# Patient Record
Sex: Male | Born: 2000 | Race: White | Hispanic: No | Marital: Single | Smoking: Never smoker
Health system: Southern US, Community
[De-identification: ages and names within clinical notes are randomized; demographics above are authoritative.]

## PROBLEM LIST (undated history)

## (undated) DIAGNOSIS — F4310 Post-traumatic stress disorder, unspecified: Secondary | ICD-10-CM

## (undated) DIAGNOSIS — F909 Attention-deficit hyperactivity disorder, unspecified type: Secondary | ICD-10-CM

## (undated) DIAGNOSIS — G3184 Mild cognitive impairment, so stated: Secondary | ICD-10-CM

## (undated) HISTORY — DX: Attention-deficit hyperactivity disorder, unspecified type: F90.9

## (undated) HISTORY — DX: Mild cognitive impairment of uncertain or unknown etiology: G31.84

## (undated) HISTORY — DX: Post-traumatic stress disorder, unspecified: F43.10

---

## 2020-07-12 ENCOUNTER — Other Ambulatory Visit: Payer: Self-pay

## 2020-07-12 ENCOUNTER — Ambulatory Visit
Admission: EM | Admit: 2020-07-12 | Discharge: 2020-07-12 | Disposition: A | Discharge status: Home / Self Care | Payer: Medicaid Other | Attending: Emergency Medicine | Admitting: Emergency Medicine

## 2020-07-12 DIAGNOSIS — R310 Gross hematuria: Secondary | ICD-10-CM | POA: Insufficient documentation

## 2020-07-12 LAB — POCT URINALYSIS DIP (MANUAL ENTRY)
Bilirubin, UA: NEGATIVE
Glucose, UA: NEGATIVE mg/dL
Ketones, POC UA: NEGATIVE mg/dL
Nitrite, UA: NEGATIVE
Protein Ur, POC: 100 mg/dL — AB
Spec Grav, UA: 1.025 (ref 1.010–1.025)
Urobilinogen, UA: 0.2 E.U./dL
pH, UA: 6.5 (ref 5.0–8.0)

## 2020-07-12 MED ORDER — SULFAMETHOXAZOLE-TRIMETHOPRIM 800-160 MG PO TABS: 1.0000 | ORAL_TABLET | Freq: Two times a day (BID) | ORAL | 0 refills | Status: AC

## 2020-07-12 NOTE — ED Provider Notes (Signed)
[facility]    CSN: [id number] Arrival date & time: 07/12/20  1745      History   Chief Complaint Chief Complaint  Patient presents with  . Blood in Urine    Started today    HPI Andrew Erickson is a 20 y.o. male presenting today for evaluation of hematuria.  Reports this afternoon he noticed his urine was a little bit darker, identically straining of his multiple drops of blood.  He denies any associated dysuria, pain with urination or tingling.  Denies penile discharge.  Patient is sexually active.  He denies any associate abdominal pain or back pain.  Denies history of similar.  HPI  History reviewed. No pertinent past medical history.  There are no problems to display for this patient.   History reviewed. No pertinent surgical history.     Home Medications    Prior to Admission medications   Medication Sig Start Date End Date Taking? Authorizing Provider  amphetamine-dextroamphetamine (ADDERALL) 10 MG tablet Take 10 mg by mouth daily with breakfast.   Yes [provider]  cloNIDine (CATAPRES) 0.1 MG tablet Take 0.1 mg by mouth 3 (three) times daily.   Yes [provider]  levocetirizine (XYZAL) 5 MG tablet Take 5 mg by mouth every evening.   Yes [provider]  lisdexamfetamine (VYVANSE) 60 MG capsule Take 60 mg by mouth every morning.   Yes [provider]  polyethylene glycol (MIRALAX / GLYCOLAX) 17 g packet Take 17 g by mouth 2 (two) times daily.   Yes [provider]  sertraline (ZOLOFT) 25 MG tablet Take 25 mg by mouth daily.   Yes [provider]  sulfamethoxazole-trimethoprim (BACTRIM DS) 800-160 MG tablet Take 1 tablet by mouth 2 (two) times daily for 7 days. 07/12/20 07/19/20 Yes Kiera Hussey C, PA-C  traZODone (DESYREL) 100 MG tablet Take 100 mg by mouth at bedtime.   Yes [provider]    Family History History reviewed. No pertinent family history.  Social History Social History    Tobacco Use  . Smoking status: Never Smoker  . Smokeless tobacco: Never Used  Vaping Use  . Vaping Use: Never used  Substance Use Topics  . Alcohol use: Not Currently  . Drug use: Not Currently     Allergies   Patient has no known allergies.   Review of Systems Review of Systems  Constitutional: Negative for fever.  HENT: Negative for sore throat.   Respiratory: Negative for shortness of breath.   Cardiovascular: Negative for chest pain.  Gastrointestinal: Negative for abdominal pain, nausea and vomiting.  Genitourinary: Positive for hematuria. Negative for difficulty urinating, dysuria, frequency, penile discharge, penile pain, penile swelling, scrotal swelling and testicular pain.  Skin: Negative for rash.  Neurological: Negative for dizziness, light-headedness and headaches.     Physical Exam Triage Vital Signs ED Triage Vitals  Enc Vitals Group     BP      Pulse      Resp      Temp      Temp src      SpO2      Weight      Height      Head Circumference      Peak Flow      Pain Score      Pain Loc      Pain Edu?      Excl. in GC?    No data found.  Updated Vital Signs BP 113/61 (BP  Location: Left Arm)   Pulse 63   Temp 98.5 F (36.9 C) (Oral)   Resp 16   SpO2 97%   Visual Acuity Right Eye Distance:   Left Eye Distance:   Bilateral Distance:    Right Eye Near:   Left Eye Near:    Bilateral Near:     Physical Exam Vitals and nursing note reviewed.  Constitutional:      Appearance: He is well-developed and well-nourished.     Comments: No acute distress  HENT:     Head: Normocephalic and atraumatic.     Nose: Nose normal.  Eyes:     Conjunctiva/sclera: Conjunctivae normal.  Cardiovascular:     Rate and Rhythm: Normal rate.  Pulmonary:     Effort: Pulmonary effort is normal. No respiratory distress.  Abdominal:     General: There is no distension.  Genitourinary:    Comments: Circumcised, no rashes or lesions noted to shaft or  glans of penis, no urethral erythema noted at meatus, no discharge visualized Musculoskeletal:        General: Normal range of motion.     Cervical back: Neck supple.  Skin:    General: Skin is warm and dry.  Neurological:     Mental Status: He is alert and oriented to person, place, and time.  Psychiatric:        Mood and Affect: Mood and affect normal.      UC Treatments / Results  Labs (all labs ordered are listed, but only abnormal results are displayed) Labs Reviewed  POCT URINALYSIS DIP (MANUAL ENTRY) - Abnormal; Notable for the following components:      Result Value   Clarity, UA cloudy (*)    Blood, UA large (*)    Protein Ur, POC =100 (*)    Leukocytes, UA Large (3+) (*)    All other components within normal limits  URINE CULTURE  CYTOLOGY, (ORAL, ANAL, URETHRAL) ANCILLARY ONLY    EKG   Radiology No results found.  Procedures Procedures (including critical care time)  Medications Ordered in UC Medications - No data to display  Initial Impression / Assessment and Plan / UC Course  I have reviewed the triage vital signs and the nursing notes.  Pertinent labs & imaging results that were available during my care of the patient were reviewed by me and considered in my medical decision making (see chart for details).     UA with large leuks, large blood, without any back pain or groin pain, lower suspicion of stone, given patient's age also concern for possible STD, but given hematuria atypical symptom of STD opting to go ahead and treat for UTI with Bactrim twice daily x1 week.  Urethral swab pending along with a urine culture to check for further confirmation.  Continue to monitor symptoms and resolution of hematuria.  Follow-up if any symptoms not improving or worsening or developing any backslash groin pain.  Discussed strict return precautions. Patient verbalized understanding and is agreeable with plan.  Final Clinical Impressions(s) / UC Diagnoses    Final diagnoses:  Gross hematuria     Discharge Instructions     Begin Bactrim twice daily for the next week Drink plenty of water Urine culture pending and STD screening pending-we will call with results and alter treatment as needed Please monitor for resolution of blood in urine, follow-up if any symptoms not improving or worsening    ED Prescriptions    Medication Sig Dispense Auth. Provider  sulfamethoxazole-trimethoprim (BACTRIM DS) 800-160 MG tablet Take 1 tablet by mouth 2 (two) times daily for 7 days. 14 tablet Jayvier Burgher, [place] C, PA-C     PDMP not reviewed this encounter.   Lew Dawes, [place] 07/12/20 1845

## 2020-07-12 NOTE — ED Triage Notes (Signed)
Patient states he noticed blood in his urine this date. Pt denies any injury. Pt states it was a small amount but pt's caregiver states it was a moderate to large amount in the toilet. Pt is aox4 and ambulatory.

## 2020-07-12 NOTE — Discharge Instructions (Addendum)
Begin Bactrim twice daily for the next week Drink plenty of water Urine culture pending and STD screening pending-we will call with results and alter treatment as needed Please monitor for resolution of blood in urine, follow-up if any symptoms not improving or worsening

## 2020-07-14 LAB — URINE CULTURE: Culture: <10000 — AB

## 2020-07-15 LAB — CYTOLOGY, (ORAL, ANAL, URETHRAL) ANCILLARY ONLY
Chlamydia: NEGATIVE
Comment: NEGATIVE
Comment: NEGATIVE
Comment: NORMAL
Neisseria Gonorrhea: NEGATIVE
Trichomonas: NEGATIVE

## 2020-07-16 ENCOUNTER — Ambulatory Visit (HOSPITAL_COMMUNITY)
Admission: EM | Admit: 2020-07-16 | Discharge: 2020-07-16 | Disposition: A | Discharge status: Home / Self Care | Payer: Medicaid Other | Attending: Family Medicine | Admitting: Family Medicine

## 2020-11-17 ENCOUNTER — Emergency Department (HOSPITAL_COMMUNITY): Payer: Medicaid Other

## 2020-11-17 ENCOUNTER — Other Ambulatory Visit: Payer: Self-pay

## 2020-11-17 ENCOUNTER — Emergency Department (HOSPITAL_COMMUNITY)
Admission: EM | Admit: 2020-11-17 | Discharge: 2020-11-18 | Disposition: A | Discharge status: Home / Self Care | Payer: Medicaid Other | Attending: Emergency Medicine | Admitting: Emergency Medicine

## 2020-11-17 ENCOUNTER — Encounter: Payer: Self-pay | Admitting: Emergency Medicine

## 2020-11-17 ENCOUNTER — Encounter (HOSPITAL_COMMUNITY): Payer: Self-pay

## 2020-11-17 DIAGNOSIS — S139XXA Sprain of joints and ligaments of unspecified parts of neck, initial encounter: Secondary | ICD-10-CM

## 2020-11-17 DIAGNOSIS — S20221A Contusion of right back wall of thorax, initial encounter: Secondary | ICD-10-CM

## 2020-11-17 DIAGNOSIS — Y92017 Garden or yard in single-family (private) house as the place of occurrence of the external cause: Secondary | ICD-10-CM | POA: Diagnosis not present

## 2020-11-17 DIAGNOSIS — F909 Attention-deficit hyperactivity disorder, unspecified type: Secondary | ICD-10-CM | POA: Insufficient documentation

## 2020-11-17 DIAGNOSIS — M542 Cervicalgia: Secondary | ICD-10-CM | POA: Diagnosis not present

## 2020-11-17 DIAGNOSIS — M549 Dorsalgia, unspecified: Secondary | ICD-10-CM | POA: Diagnosis present

## 2020-11-17 DIAGNOSIS — M546 Pain in thoracic spine: Secondary | ICD-10-CM | POA: Insufficient documentation

## 2020-11-17 DIAGNOSIS — W1789XA Other fall from one level to another, initial encounter: Secondary | ICD-10-CM | POA: Insufficient documentation

## 2020-11-17 MED ORDER — IBUPROFEN 200 MG PO TABS
400.0000 mg | ORAL_TABLET | Freq: Once | ORAL | Status: AC
  Administered 2020-11-18: 01:00:00 400 mg via ORAL
  Filled 2020-11-17: qty 2

## 2020-11-17 MED ORDER — ACETAMINOPHEN 325 MG PO TABS: 650.0000 mg | ORAL_TABLET | ORAL | Status: DC | PRN

## 2020-11-17 NOTE — ED Triage Notes (Signed)
Patient BIB [facility] from a "group home". EMS reports patient has mild cognitive impairment and Autistic. Patient reportedly got into an argument with his caregiver and purposely fell backwards off the deck 4-5 ft.Mild abrasion noted on right side of back. Patient has more c/o right shoulder and neck pain. Patient denies SI/HI to EMS. EMS stated patient was just impulsive and was not trying harm himself/

## 2020-11-17 NOTE — BH Assessment (Addendum)
Attempted to set up TTS assessment with patient's nurse. Per patient's RN, Andrew Erickson, patient is not ready in a private place for his assessment. She will let me know when he is ready.

## 2020-11-17 NOTE — ED Notes (Signed)
Pt given sandwich and water after approval from EDP Rees.

## 2020-11-17 NOTE — Discharge Instructions (Addendum)
For your behavioral health needs, you are advised to follow up with your regular outpatient provider.  For enhanced services contact your care coordinator at [facility]:       [facility]      [phone]      Care Coordinator: Alice Reichert, [id number]      Substitute for week of 11/17/20 - 11/24/20: Joe Ward [phone]      24 Hour Crisis number: [phone]

## 2020-11-17 NOTE — ED Provider Notes (Signed)
[facility]-EMERGENCY DEPT Provider Note   CSN: [id number] Arrival date & time: 11/17/20  2004     History Chief Complaint  Patient presents with   Back Pain    Damarious Holtsclaw is a 20 y.o. male.  The history is provided by the patient.  Back Pain Larkin Morelos is a 20 y.o. male who presents to the Emergency Department complaining of fall, back pain. He presents the emergency department from group home after he fell off of a balcony that was approximately 4 to 5 feet high. He states that he was upset and trying to get out of a situation that made him upset. He was not trying to hurt himself. He complains of pain to the right upper back, right lateral neck. He is able to walk without difficulty. No difficulty breathing, numbness, weakness.    Past Medical History:  Diagnosis Date   ADHD (attention deficit hyperactivity disorder)    Mild cognitive impairment    PTSD (post-traumatic stress disorder)     Patient Active Problem List   Diagnosis Date Noted   ADHD (attention deficit hyperactivity disorder) 11/17/2020    No past surgical history on file.     No family history on file.  Social History   Tobacco Use   Smoking status: Never   Smokeless tobacco: Never  Vaping Use   Vaping Use: Never used  Substance Use Topics   Alcohol use: Not Currently   Drug use: Not Currently    Home Medications Prior to Admission medications   Medication Sig Start Date End Date Taking? Authorizing Provider  amphetamine-dextroamphetamine (ADDERALL) 10 MG tablet Take 10 mg by mouth daily with breakfast.    [provider]  cloNIDine (CATAPRES) 0.1 MG tablet Take 0.1 mg by mouth 3 (three) times daily.    [provider]  levocetirizine (XYZAL) 5 MG tablet Take 5 mg by mouth every evening.    [provider]  lisdexamfetamine (VYVANSE) 60 MG capsule Take 60 mg by mouth every morning.    [provider]  polyethylene glycol (MIRALAX /  GLYCOLAX) 17 g packet Take 17 g by mouth 2 (two) times daily.    [provider]  sertraline (ZOLOFT) 25 MG tablet Take 25 mg by mouth daily.    [provider]  traZODone (DESYREL) 100 MG tablet Take 100 mg by mouth at bedtime.    [provider]    Allergies    Patient has no known allergies.  Review of Systems   Review of Systems  Musculoskeletal:  Positive for back pain.  All other systems reviewed and are negative.  Physical Exam Updated Vital Signs BP (!) 100/59 (BP Location: Right Arm)   Pulse (!) 57   Temp 98.6 F (37 C) (Oral)   Resp 18   SpO2 96%   Physical Exam Vitals and nursing note reviewed.  Constitutional:      Appearance: He is well-developed.  HENT:     Head: Normocephalic and atraumatic.  Neck:     Comments: No midline cervical spine tenderness Cardiovascular:     Rate and Rhythm: Normal rate and regular rhythm.     Heart sounds: No murmur heard. Pulmonary:     Effort: Pulmonary effort is normal. No respiratory distress.     Breath sounds: Normal breath sounds.  Abdominal:     Palpations: Abdomen is soft.     Tenderness: There is no abdominal tenderness. There is no guarding or rebound.  Musculoskeletal:  General: No tenderness.     Cervical back: Neck supple.     Comments: No midline thoracic or lumbar tenderness to palpation.  Skin:    General: Skin is warm and dry.  Neurological:     Mental Status: He is alert and oriented to person, place, and time.     Comments: Five out of five strength in all four extremities with sensation to light touch intact in all four extremities  Psychiatric:        Behavior: Behavior normal.    ED Results / Procedures / Treatments   Labs (all labs ordered are listed, but only abnormal results are displayed) Labs Reviewed - No data to display  EKG None  Radiology No results found.  Procedures Procedures   Medications Ordered in ED Medications  ibuprofen (ADVIL) tablet  400 mg (has no administration in time range)    ED Course  I have reviewed the triage vital signs and the nursing notes.  Pertinent labs & imaging results that were available during my care of the patient were reviewed by me and considered in my medical decision making (see chart for details).    MDM Rules/Calculators/A&P                         patient here for evaluation after jumping off the balcony. This was an impulsive event and he was not trying to harm himself. In terms of this event there is no evidence of serious internal injury. He does have a pulled muscle to the lateral neck. Discussed with patient's caregiver. Caregivers concern for the patient safety as he has worsening impulsive behavior. He does have PRN Ativan, which the caregiver thinks is worsening these behaviors. Caregiver is requesting psychiatry evaluation for recommendations regarding medications versus placement. Patient has been medically cleared for psychiatric evaluation and treatment. Final Clinical Impression(s) / ED Diagnoses Final diagnoses:  None    Rx / DC Orders ED Discharge Orders     None        Tilden Fossa, MD 11/17/20 2329

## 2020-11-18 NOTE — BH Assessment (Signed)
Followed up with patient's nurse regarding set up for tele-assessment. Patient is not yet in a private place. Nurse said she would let us know when he was.

## 2020-11-18 NOTE — BH Assessment (Signed)
Comprehensive Clinical Assessment (CCA) Note  11/18/2020 Andrew Erickson [id number]  DISPOSITION: Gave clinical report to [name], NP who recommends Pt be observed until TTS can obtain collateral information from legal guardian. Plan is for Pt to be psychiatrically cleared and returned to guardian's care with follow up by current providers. TTS has left guardian voicemail.  The patient demonstrates the following risk factors for suicide: Chronic risk factors for suicide include: psychiatric disorder of ADHD . Acute risk factors for suicide include: family or marital conflict. Protective factors for this patient include: positive social support, positive therapeutic relationship, hope for the future, and life satisfaction. Considering these factors, the overall suicide risk at this point appears to be low. Patient is appropriate for outpatient follow up.  Flowsheet Row ED from 11/17/2020 in [facility] Andrew Erickson ED from 07/12/2020 in Andrew Erickson   C-SSRS RISK CATEGORY No Risk No Risk      Pt is a 20 year old single male who presents unaccompanied to [facility] ED after being transported by EMS after intentionally falling 4-5 feet from a balcony. Per medical record, Pt has a diagnosis of ADHD and mild cognitive impairment. Pt says he lives in an AFL with his legal guardian, Andrew Erickson [phone]. Pt says he and his guardian were arguing because Pt wanted to spend his money on a drone. Pt reports he intentionally fell off the balcony "to prove a point" that his guardian does not care about Pt's welfare as much as he professes. Pt says his guardian did not seem very concerned after Pt fell and simply called 911.   Pt denies his intent was to seriously harm himself. He denies experiencing suicidal ideation and denies this was a suicide attempt. He denies any history of suicide attempts or NSSIB. Pt denies depressive symptoms and says his mood as  been good. He denies problems with sleep or appetite. He denies current homicidal ideation or history of aggression. He denies auditory or visual hallucinations. He denies use of alcohol or other substances.  Pt does not identify any specific stressors. He says his father died 3-4 years ago from an infection and Pt is still grieving this loss. Pt says he has lived in current AFL 6-7 months. Pt says he enjoys a lot of activities including working on cars, riding 4-wheelers, and fishing. He says he is entering the eleventh grade. Pt denies history of abuse. He denies legal problems. He denies access to firearms. Pt says he currently does not see a therapist. He says he is not sure what medications he is prescribed but he takes them regularly. Pt states he does not know who prescribes his medications.  TTS attempted to contact Andrew Erickson at [phone] for collateral information. HIPAA-compliant voicemail was recorded asking Mr Andrew Erickson to contact TTS.  Pt is dressed in t-shirt and is alert and oriented x4. Pt speaks in a clear tone, at moderate volume and normal pace. Motor behavior appears normal. Eye contact is good. Pt's mood is euthymic and affect is congruent with mood. Thought process is coherent and relevant. There is no indication Pt is currently responding to internal stimuli or experiencing delusional thought content. Pt was polite and cooperative throughout assessment. He says he feels comfortable returning to the AFL.   Chief Complaint:  Chief Complaint  Patient presents with   Back Pain   Visit Diagnosis: F90.2 Attention-deficit/hyperactivity disorder    CCA Screening, Triage and Referral (STR)  Patient  Reported Information How did you hear about [place]? No data recorded Referral name: No data recorded Referral phone number: No data recorded  Whom do you see for routine medical problems? No data recorded Practice/Facility Name: No data recorded Practice/Facility Phone Number: No  data recorded Name of Contact: No data recorded Contact Number: No data recorded Contact Fax Number: No data recorded Prescriber Name: No data recorded Prescriber Address (if known): No data recorded  What Is the Reason for Your Visit/Call Today? No data recorded How Long Has This Been Causing You Problems? No data recorded What Do You Feel Would Help You the Most Today? No data recorded  Have You Recently Been in Any Inpatient Treatment (Hospital/Detox/Crisis Center/28-Day Program)? No data recorded Name/Location of Program/Hospital:No data recorded How Long Were You There? No data recorded When Were You Discharged? No data recorded  Have You Ever Received Services From [facility] Before? No data recorded Who Do You See at [facility]? No data recorded  Have You Recently Had Any Thoughts About Hurting Yourself? No data recorded Are You Planning to Commit Suicide/Harm Yourself At This time? No data recorded  Have you Recently Had Thoughts About Hurting Someone [name]? No data recorded Explanation: No data recorded  Have You Used Any Alcohol or Drugs in the Past 24 Hours? No data recorded How Long Ago Did You Use Drugs or Alcohol? No data recorded What Did You Use and How Much? No data recorded  Do You Currently Have a Therapist/Psychiatrist? No data recorded Name of Therapist/Psychiatrist: No data recorded  Have You Been Recently Discharged From Any Office Practice or Programs? No data recorded Explanation of Discharge From Practice/Program: No data recorded    CCA Screening Triage Referral Assessment Type of Contact: No data recorded Is this Initial or Reassessment? No data recorded Date Telepsych consult ordered in [facility]:  No data recorded Time Telepsych consult ordered in [facility]:  No data recorded  Patient Reported Information Reviewed? No data recorded Patient Left Without Being Seen? No data recorded Reason for Not Completing Assessment: No data recorded  Collateral  Involvement: No data recorded  Does Patient Have a Court Appointed Legal Guardian? No data recorded Name and Contact of Legal Guardian: No data recorded If Minor and Not Living with Parent(s), Who has Custody? No data recorded Is CPS involved or ever been involved? No data recorded Is APS involved or ever been involved? No data recorded  Patient Determined To Be At Risk for Harm To Self or Others Based on Review of Patient Reported Information or Presenting Complaint? No data recorded Method: No data recorded Availability of Means: No data recorded Intent: No data recorded Notification Required: No data recorded Additional Information for Danger to Others Potential: No data recorded Additional Comments for Danger to Others Potential: No data recorded Are There Guns or Other Weapons in Your Home? No data recorded Types of Guns/Weapons: No data recorded Are These Weapons Safely Secured?                            No data recorded Who Could Verify You Are Able To Have These Secured: No data recorded Do You Have any Outstanding Charges, Pending Court Dates, Parole/Probation? No data recorded Contacted To Inform of Risk of Harm To Self or Others: No data recorded  Location of Assessment: No data recorded  Does Patient Present under Involuntary Commitment? No data recorded IVC Papers Initial File Date: No data recorded  [place] of Residence: No data recorded  Patient Currently Receiving the Following Services: No data recorded  Determination of Need: No data recorded  Options For Referral: No data recorded    CCA Biopsychosocial Intake/Chief Complaint:  No data recorded Current Symptoms/Problems: No data recorded  Patient Reported Schizophrenia/Schizoaffective Diagnosis in Past: No   Strengths: Pt is pleasant and cooperative  Preferences: No data recorded Abilities: No data recorded  Type of Services Patient Feels are Needed: No data recorded  Initial Clinical  Notes/Concerns: No data recorded  Mental Erickson Symptoms Depression:   None   Duration of Depressive symptoms: No data recorded  Mania:   None   Anxiety:    None   Psychosis:   None   Duration of Psychotic symptoms: No data recorded  Trauma:   None   Obsessions:   None   Compulsions:   None   Inattention:   Avoids/dislikes activities that require focus; Disorganized; Fails to pay attention/makes careless mistakes; Symptoms before age 68   Hyperactivity/Impulsivity:   Feeling of restlessness   Oppositional/Defiant Behaviors:   None   Emotional Irregularity:   None   Other Mood/Personality Symptoms:  No data recorded   Mental Status Exam Appearance and self-care  Stature:   Average   Weight:   Average weight   Clothing:   Casual   Grooming:   Normal   Cosmetic use:   None   Posture/gait:   Normal   Motor activity:   Not Remarkable   Sensorium  Attention:   Normal   Concentration:   Normal   Orientation:   X5   Recall/memory:   Normal   Affect and Mood  Affect:   Appropriate   Mood:   Euthymic   Relating  Eye contact:   Normal   Facial expression:   Responsive   Attitude toward examiner:   Cooperative   Thought and Language  Speech flow:  Clear and Coherent   Thought content:   Appropriate to Mood and Circumstances   Preoccupation:   None   Hallucinations:   None   Organization:  No data recorded  [employer] of Knowledge:   Fair   Intelligence:   Below average   Abstraction:   Functional   Judgement:   Fair   Dance movement psychotherapist:   Adequate   Insight:   Shallow   Decision Making:   Impulsive   Social Functioning  Social Maturity:   Impulsive   Social Judgement:   Naive   Stress  Stressors:   Grief/losses   Coping Ability:   Normal   Skill Deficits:   Intellect/education   Supports:   Friends/Service system     Religion: Religion/Spirituality Are You A  Religious Person?: Yes What is Your Religious Affiliation?: Christian How Might This Affect Treatment?: NA  Leisure/Recreation: Leisure / Recreation Do You Have Hobbies?: Yes Leisure and Hobbies: Working on cars, fishing, riding 4-wheelers  Exercise/Diet: Exercise/Diet Do You Exercise?: Yes What Type of Exercise Do You Do?: Run/Walk How Many Times a Week Do You Exercise?: 1-3 times a week Have You Gained or Lost A Significant Amount of Weight in the Past Six Months?: No Do You Follow a Special Diet?: No Do You Have Any Trouble Sleeping?: No   CCA Employment/Education Employment/Work Situation: Employment / Work Situation Employment Situation: Surveyor, minerals Job has Been Impacted by Current Illness: No Has Patient ever Been in the [employer]?: No  Education: Education Is Patient Currently Attending School?: Yes Last  Grade Completed: 10 Did You Attend College?: No Did You Have An Individualized Education Program (IIEP): Yes Did You Have Any Difficulty At School?: Yes Were Any Medications Ever Prescribed For These Difficulties?: Yes Medications Prescribed For School Difficulties?: Pt cannot remember Patient's Education Has Been Impacted by Current Illness: Yes How Does Current Illness Impact Education?: Pt diagnosed with ADHD and mild cognitive impairment.   CCA Family/Childhood History Family and Relationship History: Family history Marital status: Single Does patient have children?: No  Childhood History:  Childhood History By whom was/is the patient raised?: Other (Comment) Did patient suffer any verbal/emotional/physical/sexual abuse as a child?: No Did patient suffer from severe childhood neglect?: No Has patient ever been sexually abused/assaulted/raped as an adolescent or adult?: No Was the patient ever a victim of a crime or a disaster?: No Witnessed domestic violence?: No Has patient been affected by domestic violence as an adult?: No  Child/Adolescent  Assessment:     CCA Substance Use Alcohol/Drug Use:                           ASAM's:  Six Dimensions of Multidimensional Assessment  Dimension 1:  Acute Intoxication and/or Withdrawal Potential:      Dimension 2:  Biomedical Conditions and Complications:      Dimension 3:  Emotional, Behavioral, or Cognitive Conditions and Complications:     Dimension 4:  Readiness to Change:     Dimension 5:  Relapse, Continued use, or Continued Problem Potential:     Dimension 6:  Recovery/Living Environment:     ASAM Severity Score:    ASAM Recommended Level of Treatment:     Substance use Disorder (SUD)    Recommendations for Services/Supports/Treatments:    DSM5 Diagnoses: Patient Active Problem List   Diagnosis Date Noted   ADHD (attention deficit hyperactivity disorder) 11/17/2020    Patient Centered Plan: Patient is on the following Treatment Plan(s):  Impulse Control   Referrals to Alternative Service(s): Referred to Alternative Service(s):   Place:   Date:   Time:    Referred to Alternative Service(s):   Place:   Date:   Time:    Referred to Alternative Service(s):   Place:   Date:   Time:    Referred to Alternative Service(s):   Place:   Date:   Time:     Pamalee LeydenWarrick Jr, Reather Steller Ellis, [facility]

## 2020-11-18 NOTE — ED Notes (Signed)
Patient was given his lunch tray.

## 2020-11-18 NOTE — Consult Note (Signed)
Gergory Biello is a 20 year old male with mild cognitive impairment, Intellectual disability who presented after throwing himself over the balcony(4-5 ft). He was seen and assessed by TTS, and collateral was obtained. Patient appears to be safe to return home. Collateral obtained from [employer] (legal guardian) who reports patient is receiving telepsych services from Dr. Caralee Ates. He is requesting therapeutic services, and has been attempting to call Laurena Bering, care coordinator, and QP at [employer]. Mr [name] continues to be concerned about patients poor impulse control "when he doesn't get his way he would cause harm to himself or to others. He can be threatening at times."   We discussed several resources in the community to include [facility], [employer], and Medication management at [facility]. He ws provided phone numbers for the above resources, and this information will be palced in AVS. Also discussed d/c of methamphetamine and consider other medications such as Concerta or methylphenidate. He verbalized understanding about limiting of resources in the area to include appropriate hospitals to manage his behaviors in the event he is suicidal or threatening to harm others.   Patient to be psychiatrically cleared at this time.

## 2020-11-18 NOTE — BH Assessment (Addendum)
BHH Assessment Progress Note   Per Caryn Bee, NP, this voluntary pt does not require psychiatric hospitalization at this time.  Pt is psychiatrically cleared.  Pt reportedly has an IDD care coordinator throught [facility]  At 11:12 this writer called Laurena Bering and was informed that this is indeed the case, and that her name is Alice Reichert [phone]), however she will be out of the office this week.  Substituting is Public affairs consultant [phone]).  I was transferred to his voice mail and I left a message.  Discharge instructions include these contacts along with the 24 hour crisis number for [facility].  At [facility] request I called [name] at pt's AFL at 11:21.  He is also pt's putative legal guardian, although no letter of guardianship appears in pt's EPIC record.  [name] reports that he is not legal guardian, stating that the guardian is [name] with [employer] of  and providing her phone number.  At 11:32 I called her.  She confirms that she is legal guardian and agrees to fax letter of guardianship to me, which I have since received.  I informed her that pt disposition and obtained verbal consent to inform [name] of these details.  At 12:14 I called him back, informing him of disposition and noting that contact information for pt's care coordinator may be found on pt's AVS.  He agrees to come to [facility] to pick pt up, but will not be able to come until this afternoon.  Pt will remain at [facility] in the meantime.  EDP Lorre Nick, MD and pt's nurse, Addison Naegeli, have been notified.  Doylene Canning, MA Triage Specialist [phone]

## 2020-11-18 NOTE — BH Assessment (Signed)
Patient was seen earlier this date by this writer to evaluate current mental health status. Patient denies any S/I, H/I or AVH. Patient per chart has a mild cognitive impairment and IDD who initially presented after jumping off a 4 foot balcony after becoming upset at a care worker. Patient denies that was a attempt to self harm and states he "was trying to get some attention." Patient resides at a local group home and has a guardian Adam Phenix [phone]. Patient is pleasant and cooperative this date and states he is "ready to go home." Patient contacts for safety and is remorseful in reference to the incident that occurred prior to arrival.

## 2020-11-18 NOTE — ED Notes (Signed)
TTS completed. Andrew Bering, NP recommends Pt be observed until TTS can obtain collateral information from legal guardian. Plan is for Pt to be psychiatrically cleared and returned to guardian's care with follow up by current providers. TTS has left guardian voicemail

## 2020-11-26 ENCOUNTER — Other Ambulatory Visit: Payer: Self-pay

## 2020-11-26 ENCOUNTER — Ambulatory Visit (HOSPITAL_COMMUNITY)
Admission: EM | Admit: 2020-11-26 | Discharge: 2020-11-27 | Disposition: A | Discharge status: Home / Self Care | Payer: No Typology Code available for payment source | Attending: Student | Admitting: Student

## 2020-11-26 DIAGNOSIS — Z20822 Contact with and (suspected) exposure to covid-19: Secondary | ICD-10-CM | POA: Insufficient documentation

## 2020-11-26 DIAGNOSIS — F919 Conduct disorder, unspecified: Secondary | ICD-10-CM | POA: Diagnosis not present

## 2020-11-26 DIAGNOSIS — F909 Attention-deficit hyperactivity disorder, unspecified type: Secondary | ICD-10-CM | POA: Diagnosis not present

## 2020-11-26 DIAGNOSIS — F4310 Post-traumatic stress disorder, unspecified: Secondary | ICD-10-CM | POA: Insufficient documentation

## 2020-11-26 DIAGNOSIS — Z79899 Other long term (current) drug therapy: Secondary | ICD-10-CM | POA: Insufficient documentation

## 2020-11-26 DIAGNOSIS — R4689 Other symptoms and signs involving appearance and behavior: Secondary | ICD-10-CM | POA: Insufficient documentation

## 2020-11-26 LAB — CBC WITH DIFFERENTIAL/PLATELET
Abs Immature Granulocytes: 0.02 10*3/uL (ref 0.00–0.07)
Basophils Absolute: 0.1 10*3/uL (ref 0.0–0.1)
Basophils Relative: 1 %
Eosinophils Absolute: 0.2 10*3/uL (ref 0.0–0.5)
Eosinophils Relative: 2 %
HCT: 44.8 % (ref 39.0–52.0)
Hemoglobin: 14.9 g/dL (ref 13.0–17.0)
Immature Granulocytes: 0 %
Lymphocytes Relative: 32 %
Lymphs Abs: 2.2 10*3/uL (ref 0.7–4.0)
MCH: 27.7 pg (ref 26.0–34.0)
MCHC: 33.3 g/dL (ref 30.0–36.0)
MCV: 83.3 fL (ref 80.0–100.0)
Monocytes Absolute: 0.6 10*3/uL (ref 0.1–1.0)
Monocytes Relative: 9 %
Neutro Abs: 3.8 10*3/uL (ref 1.7–7.7)
Neutrophils Relative %: 56 %
Platelets: 262 10*3/uL (ref 150–400)
RBC: 5.38 MIL/uL (ref 4.22–5.81)
RDW: 12.8 % (ref 11.5–15.5)
WBC: 6.8 10*3/uL (ref 4.0–10.5)
nRBC: 0 % (ref 0.0–0.2)

## 2020-11-26 LAB — LIPID PANEL
Cholesterol: 135 mg/dL (ref 0–200)
HDL: 43 mg/dL (ref >40)
LDL Cholesterol: 80 mg/dL (ref 0–99)
Total CHOL/HDL Ratio: 3.1 RATIO
Triglycerides: 62 mg/dL (ref <150)
VLDL: 12 mg/dL (ref 0–40)

## 2020-11-26 LAB — POCT URINE DRUG SCREEN - MANUAL ENTRY (I-SCREEN)
POC Amphetamine UR: POSITIVE — AB
POC Buprenorphine (BUP): NOT DETECTED
POC Cocaine UR: NOT DETECTED
POC Marijuana UR: NOT DETECTED
POC Methadone UR: NOT DETECTED
POC Methamphetamine UR: NOT DETECTED
POC Morphine: NOT DETECTED
POC Oxazepam (BZO): POSITIVE — AB
POC Oxycodone UR: NOT DETECTED
POC Secobarbital (BAR): NOT DETECTED

## 2020-11-26 LAB — HEMOGLOBIN A1C
Hgb A1c MFr Bld: 5.5 % (ref 4.8–5.6)
Mean Plasma Glucose: 111.15 mg/dL

## 2020-11-26 LAB — COMPREHENSIVE METABOLIC PANEL
ALT: 17 U/L (ref 0–44)
AST: 26 U/L (ref 15–41)
Albumin: 4.6 g/dL (ref 3.5–5.0)
Alkaline Phosphatase: 77 U/L (ref 38–126)
Anion gap: 7 (ref 5–15)
BUN: 17 mg/dL (ref 6–20)
CO2: 29 mmol/L (ref 22–32)
Calcium: 9.5 mg/dL (ref 8.9–10.3)
Chloride: 102 mmol/L (ref 98–111)
Creatinine, Ser: 1.2 mg/dL (ref 0.61–1.24)
GFR, Estimated: >60 mL/min (ref >60)
Glucose, Bld: 108 mg/dL — ABNORMAL HIGH (ref 70–99)
Potassium: 3.7 mmol/L (ref 3.5–5.1)
Sodium: 138 mmol/L (ref 135–145)
Total Bilirubin: 1.7 mg/dL — ABNORMAL HIGH (ref 0.3–1.2)
Total Protein: 7.2 g/dL (ref 6.5–8.1)

## 2020-11-26 LAB — TSH: TSH: 0.543 u[IU]/mL (ref 0.350–4.500)

## 2020-11-26 LAB — POC SARS CORONAVIRUS 2 AG -  ED: SARS Coronavirus 2 Ag: NEGATIVE

## 2020-11-26 LAB — POC SARS CORONAVIRUS 2 AG: SARSCOV2ONAVIRUS 2 AG: NEGATIVE

## 2020-11-26 MED ORDER — SERTRALINE HCL 50 MG PO TABS
50.0000 mg | ORAL_TABLET | Freq: Every day | ORAL | Status: DC
  Administered 2020-11-27: 50 mg via ORAL
  Filled 2020-11-26: qty 1

## 2020-11-26 MED ORDER — ALUM & MAG HYDROXIDE-SIMETH 200-200-20 MG/5ML PO SUSP: 30.0000 mL | ORAL | Status: DC | PRN

## 2020-11-26 MED ORDER — MAGNESIUM HYDROXIDE 400 MG/5ML PO SUSP: 30.0000 mL | Freq: Every day | ORAL | Status: DC | PRN

## 2020-11-26 MED ORDER — DEXTROAMPHETAMINE SULFATE 5 MG PO TABS: 10.0000 mg | ORAL_TABLET | Freq: Every day | ORAL | Status: DC

## 2020-11-26 MED ORDER — VITAMIN D 25 MCG (1000 UNIT) PO TABS
1000.0000 [IU] | ORAL_TABLET | Freq: Every day | ORAL | Status: DC
  Administered 2020-11-27: 1000 [IU] via ORAL
  Filled 2020-11-26: qty 1

## 2020-11-26 MED ORDER — LORATADINE 10 MG PO TABS: 10.0000 mg | ORAL_TABLET | Freq: Every evening | ORAL | Status: DC

## 2020-11-26 MED ORDER — FLUTICASONE PROPIONATE 50 MCG/ACT NA SUSP: 2.0000 | Freq: Every day | NASAL | Status: DC

## 2020-11-26 MED ORDER — LISDEXAMFETAMINE DIMESYLATE 30 MG PO CAPS
60.0000 mg | ORAL_CAPSULE | ORAL | Status: DC
  Administered 2020-11-27: 60 mg via ORAL
  Filled 2020-11-26: qty 2

## 2020-11-26 MED ORDER — CETIRIZINE HCL 10 MG PO TABS
10.0000 mg | ORAL_TABLET | Freq: Every evening | ORAL | Status: DC
  Filled 2020-11-26: qty 1

## 2020-11-26 MED ORDER — POLYETHYLENE GLYCOL 3350 17 G PO PACK
17.0000 g | PACK | Freq: Two times a day (BID) | ORAL | Status: DC
  Filled 2020-11-26: qty 1

## 2020-11-26 MED ORDER — CARIPRAZINE HCL 4.5 MG PO CAPS
1.0000 | ORAL_CAPSULE | Freq: Every evening | ORAL | Status: DC
  Filled 2020-11-26: qty 1

## 2020-11-26 MED ORDER — TRAZODONE HCL 100 MG PO TABS
100.0000 mg | ORAL_TABLET | Freq: Two times a day (BID) | ORAL | Status: DC
  Administered 2020-11-27: 100 mg via ORAL
  Filled 2020-11-26: qty 1

## 2020-11-26 MED ORDER — ACETAMINOPHEN 325 MG PO TABS: 650.0000 mg | ORAL_TABLET | Freq: Four times a day (QID) | ORAL | Status: DC | PRN

## 2020-11-26 MED ORDER — CLONIDINE HCL 0.1 MG PO TABS
0.1000 mg | ORAL_TABLET | Freq: Three times a day (TID) | ORAL | Status: DC
  Administered 2020-11-27: 0.1 mg via ORAL
  Filled 2020-11-26: qty 1

## 2020-11-26 MED ORDER — LORAZEPAM 0.5 MG PO TABS
0.5000 mg | ORAL_TABLET | Freq: Two times a day (BID) | ORAL | Status: DC
  Administered 2020-11-27: 0.5 mg via ORAL
  Filled 2020-11-26: qty 1

## 2020-11-26 NOTE — ED Notes (Signed)
Patient sleeping.  RR even and unlabored.  Continue to monitor for safety. 
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
## 2020-11-26 NOTE — ED Provider Notes (Signed)
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 medications for today (11/26/2020).  Will continue the following home medications to  start tomorrow on 11/27/2020:  -Cariprazine (Vraylar) 4.5 mg p.o. every evening for mood stability  -Vitamin D3 tablet 1000 units p.o. daily for vitamin D3 supplementation  -Clonidine 0.1 mg p.o. 3 times daily for ADHD/PTSD  -Dextroamphetamine 10 mg p.o. daily for ADHD  -Flonase 50 MCG/ACT nasal spray: 2 sprays each nares daily for seasonal allergies  -Vyvanse 60 mg p.o. each morning for ADHD  -Claritin 10 mg p.o. every evening for seasonal allergies (formulary alternative to patient's home medication for levocetirizine 5 mg p.o. every evening)  -Ativan 0.5 mg p.o. twice daily for anxiety/agitation  -Polyethylene glycol (MiraLAX/GlycoLax) packet 17 g p.o. twice daily for intermittent chronic constipation  -Zoloft 50 mg p.o. daily for depression  -Trazodone 100 mg p.o. twice daily for sleep/insomnia/depression/agitation  Jaclyn ShaggyCody W Wing Gfeller, PA-C 11/26/20  10:59 PM

## 2020-11-26 NOTE — ED Notes (Signed)
Blue locker 

## 2020-11-26 NOTE — ED Notes (Signed)
Locker #[id number] 

## 2020-11-27 LAB — RESP PANEL BY RT-PCR (FLU A&B, COVID) ARPGX2
Influenza A by PCR: NEGATIVE
Influenza B by PCR: NEGATIVE
SARS Coronavirus 2 by RT PCR: NEGATIVE

## 2020-11-27 NOTE — ED Notes (Signed)
Andrew Erickson is sleeping no distress noted skin color appropriate for ethnicity raise and fall of chest WNL .

## 2020-11-27 NOTE — ED Notes (Signed)
PT writing on the walls informed him to stop because he is discharging. He stopped and said "okay good"

## 2020-11-27 NOTE — ED Triage Notes (Signed)
Pt brought from group home for aggressive bx denies SI/HI

## 2020-11-27 NOTE — ED Provider Notes (Signed)
[facility]/OBS ASAP Discharge Summary  Date and Time: 11/27/2020 11:17 AM  Name: Andrew Erickson  MRN:  [id number]   Discharge Diagnoses:  Final diagnoses:  Attention deficit hyperactivity disorder (ADHD), unspecified ADHD type  Aggressive behavior    Subjective: He reports that he is doing ok today. He reports that he has no SI, HI, or AVH. He reports he slept ok but because of all the noise and light he did not sleep great. He reports that he his appetite is ok. He did say "Isn't this place supposed to help people." When asked if would want therapy he said that yes he thinks that would help. Discussed I would ask Social Work to provide information on therapy and he was agreeable. He has no other concerns at present.  Stay Summary: Patient presented on 6/21 to the [facility] via [facility] to be evaluated for his aggressive behavior. He was admitted for observation and his home medications were restarted. He slept ok through the night and had no behavioral issues while being observed. On 6/22 his group home was contacted to return him as he was psychiatrically cleared. He was discharged back to his group home.  Total Time spent with patient: 30 minutes  Past Psychiatric History: ADHD, mild cognitive impairment (per medical record and patient's caregiver), as well as reported history of PTSD, persistent depressive disorder, conduct disorder, and mild IDD (per patient's caregiver) Past Medical History:  Past Medical History:  Diagnosis Date   ADHD (attention deficit hyperactivity disorder)    Mild cognitive impairment    PTSD (post-traumatic stress disorder)    No past surgical history on file. Family History: No family history on file. Family Psychiatric History: No family history on file Social History:  Social History   Substance and Sexual Activity  Alcohol Use Not Currently     Social History   Substance and Sexual Activity  Drug Use Not Currently    Social History   Socioeconomic History    Marital status: Single    Spouse name: Not on file   Number of children: Not on file   Years of education: Not on file   Highest education level: Not on file  Occupational History   Not on file  Tobacco Use   Smoking status: Never   Smokeless tobacco: Never  Vaping Use   Vaping Use: Never used  Substance and Sexual Activity   Alcohol use: Not Currently   Drug use: Not Currently   Sexual activity: Not Currently  Other Topics Concern   Not on file  Social History Narrative   Not on file   Social Determinants of Health   Financial Resource Strain: Not on file  Food Insecurity: Not on file  Transportation Needs: Not on file  Physical Activity: Not on file  Stress: Not on file  Social Connections: Not on file   SDOH:  SDOH Screenings   Alcohol Screen: Not on file  Depression (PHQ2-9): Not on file  Financial Resource Strain: Not on file  Food Insecurity: Not on file  Housing: Not on file  Physical Activity: Not on file  Social Connections: Not on file  Stress: Not on file  Tobacco Use: Low Risk    Smoking Tobacco Use: Never   Smokeless Tobacco Use: Never  Transportation Needs: Not on file    Tobacco Cessation:  N/A, patient does not currently use tobacco products  Current Medications:  Current Facility-Administered Medications  Medication Dose Route Frequency Provider Last Rate Last Admin   acetaminophen (  TYLENOL) tablet 650 mg  650 mg Oral Q6H PRN Jaclyn Shaggy, PA-C       alum & mag hydroxide-simeth (MAALOX/MYLANTA) 200-200-20 MG/5ML suspension 30 mL  30 mL Oral Q4H PRN Jaclyn Shaggy, PA-C       Cariprazine HCl CAPS 4.5 mg  1 capsule Oral QPM Melbourne Abts W, PA-C       cholecalciferol (VITAMIN D3) tablet 1,000 Units  1,000 Units Oral Daily Jaclyn Shaggy, PA-C   1,000 Units at 11/27/20 0935   cloNIDine (CATAPRES) tablet 0.1 mg  0.1 mg Oral TID Jaclyn Shaggy, PA-C   0.1 mg at 11/27/20 0935   dextroamphetamine (DEXTROSTAT) tablet 10 mg  10 mg Oral Daily Melbourne Abts W, PA-C       fluticasone [facility]) 50 MCG/ACT nasal spray 2 spray  2 spray Each Nare Daily Melbourne Abts W, PA-C       lisdexamfetamine (VYVANSE) capsule 60 mg  60 mg Oral BH-q7a Taylor, Cody W, PA-C   60 mg at 11/27/20 0935   loratadine (CLARITIN) tablet 10 mg  10 mg Oral QPM Melbourne Abts W, PA-C       LORazepam (ATIVAN) tablet 0.5 mg  0.5 mg Oral BID Melbourne Abts W, PA-C   0.5 mg at 11/27/20 0935   polyethylene glycol (MIRALAX / GLYCOLAX) packet 17 g  17 g Oral BID Melbourne Abts W, PA-C       sertraline (ZOLOFT) tablet 50 mg  50 mg Oral Daily Melbourne Abts W, PA-C   50 mg at 11/27/20 0935   traZODone (DESYREL) tablet 100 mg  100 mg Oral BID Melbourne Abts W, PA-C   100 mg at 11/27/20 0938   Current Outpatient Medications  Medication Sig Dispense Refill   dextroamphetamine (DEXTROSTAT) 10 MG tablet Take 10 mg by mouth daily.     amphetamine-dextroamphetamine (ADDERALL) 10 MG tablet Take 20 mg by mouth daily with breakfast. (Patient not taking: Reported on 11/26/2020)     Cariprazine HCl (VRAYLAR) 4.5 MG CAPS Take 1 capsule by mouth every evening.     cholecalciferol (VITAMIN D3) 25 MCG (1000 UNIT) tablet Take 1,000 Units by mouth daily.     cloNIDine (CATAPRES) 0.1 MG tablet Take 0.1 mg by mouth 3 (three) times daily.     fluticasone (FLONASE) 50 MCG/ACT nasal spray Place 2 sprays into both nostrils daily.     levocetirizine (XYZAL) 5 MG tablet Take 5 mg by mouth every evening.     lisdexamfetamine (VYVANSE) 60 MG capsule Take 60 mg by mouth every morning.     LORazepam (ATIVAN) 0.5 MG tablet Take 0.5 mg by mouth 2 (two) times daily.     polyethylene glycol (MIRALAX / GLYCOLAX) 17 g packet Take 17 g by mouth 2 (two) times daily.     sertraline (ZOLOFT) 50 MG tablet Take 50 mg by mouth daily.     traZODone (DESYREL) 100 MG tablet Take 100 mg by mouth 2 (two) times daily.      PTA Medications: (Not in a hospital admission)   Musculoskeletal  Strength & Muscle Tone: within normal  limits Gait & Station: normal Patient leans: N/A  Psychiatric Specialty Exam  Presentation  General Appearance: Appropriate for Environment; Casual  Eye Contact:Good  Speech:Clear and Coherent; Normal Rate  Speech Volume:Normal  Handedness: No data recorded  Mood and Affect  Mood:Dysphoric  Affect:Congruent   Thought Process  Thought Processes:Coherent; Goal Directed  Descriptions of Associations:Intact  Orientation:Full (Time, Place and Person)  Thought Content:WDL  Diagnosis of Schizophrenia or Schizoaffective disorder in past: No    Hallucinations:Hallucinations: None  Ideas of Reference:None  Suicidal Thoughts:Suicidal Thoughts: No  Homicidal Thoughts:Homicidal Thoughts: No   Sensorium  Memory:Immediate Fair; Recent Fair; Remote Fair  Judgment:Fair  Insight:Fair   Executive Functions  Concentration:Good  Attention Span:Good  Recall:Good  Fund of Knowledge:Good  Language:Good   Psychomotor Activity  Psychomotor Activity:Psychomotor Activity: Normal   Assets  Assets:Communication Skills; Housing; Physical Health; Resilience   Sleep  Sleep:Sleep: Good   Nutritional Assessment (For OBS and [facility]BC admissions only) Has the patient had a weight loss or gain of 10 pounds or more in the last 3 months?: No Has the patient had a decrease in food intake/or appetite?: No Does the patient have dental problems?: No Does the patient have eating habits or behaviors that may be indicators of an eating disorder including binging or inducing vomiting?: No Has the patient recently lost weight without trying?: No Has the patient been eating poorly because of a decreased appetite?: No Malnutrition Screening Tool Score: 0   Physical Exam  Physical Exam Vitals and nursing note reviewed.  Constitutional:      General: He is not in acute distress.    Appearance: Normal appearance. He is normal weight. He is not ill-appearing.  HENT:     Head:  Normocephalic and atraumatic.  Cardiovascular:     Rate and Rhythm: Normal rate.  Pulmonary:     Effort: Pulmonary effort is normal.  Musculoskeletal:        General: Normal range of motion.  Neurological:     Mental Status: He is alert.   Review of Systems  Constitutional:  Negative for chills and fever.  Respiratory:  Negative for cough and shortness of breath.   Cardiovascular:  Negative for chest pain.  Gastrointestinal:  Negative for abdominal pain, constipation, diarrhea, nausea and vomiting.  Neurological:  Negative for dizziness and weakness.  Psychiatric/Behavioral:  Negative for depression, hallucinations and suicidal ideas.   Blood pressure 106/86, pulse 82, temperature 97.8 F (36.6 C), temperature source Oral, resp. rate 18, SpO2 99 %. There is no height or weight on file to calculate BMI.  Demographic Factors:  Male  Loss Factors: NA  Historical Factors: Prior suicide attempts and Impulsivity  Risk Reduction Factors:   NA  Continued Clinical Symptoms:  Mild Cognitive Impairment  Cognitive Features That Contribute To Risk:  Loss of executive function    Suicide Risk:  Mild:  Suicidal ideation of limited frequency, intensity, duration, and specificity.  There are no identifiable plans, no associated intent, mild dysphoria and related symptoms, good self-control (both objective and subjective assessment), few other risk factors, and identifiable protective factors, including available and accessible social support.  Plan Of Care/Follow-up recommendations:  - Activity as tolerated. - Diet as recommended by PCP. - Keep all scheduled follow-up appointments as recommended.  Disposition: Discharge back to your Group Home  Lauro Franklin, MD 11/27/2020, 11:17 AM

## 2020-11-27 NOTE — Discharge Instructions (Addendum)
Patient is instructed to take all prescribed medications as recommended. Report any side effects or adverse reactions to your outpatient psychiatrist. Patient is instructed to abstain from alcohol and illegal drugs while on prescription medications. In the event of worsening symptoms, patient is instructed to call the crisis hotline, 911, or go to the nearest emergency department for evaluation and treatment.   

## 2020-11-27 NOTE — Progress Notes (Signed)
[facility] LCSW Note  11/27/2020   10:31 AM  Type of Contact and Topic:  Follow Up Care  CSW attempted to contact pt's caregiver, [name] [phone]) to coordinate discharge. CSW left a HIPAA-compliant message for a return call.  Wyvonnia Lora, LCSWA 11/27/2020  10:31 AM

## 2020-11-27 NOTE — ED Notes (Signed)
PT put chair on top of him while laying down informed him that's not how you get attention you want and how dangerous that can be.

## 2020-11-27 NOTE — ED Notes (Signed)
Andrew Erickson is asleep skin pink respiration 14 to 16 and easy no distress noted.

## 2020-11-27 NOTE — ED Notes (Signed)
Pt asleep with even and unlabored respirations. No distress or discomfort noted. Pt remains safe on the unit. Will continue to monitor. 

## 2020-11-27 NOTE — ED Notes (Signed)
Pt remains asleep no distress noted 

## 2020-11-27 NOTE — Progress Notes (Addendum)
[facility] LCSW Note  11/27/2020   12:50 PM  Type of Contact and Topic:  Discharge Planning  CSW spoke with [name], pt's caregiver, who stated pt can return between 5:00 pm and 6:00 pm. CSW will inform nursing staff. If [facility] cannot provide transportation, CSW will contact [name] again to inform him that he will need to pick pt up, to which [name] was agreeable.  Update: Per conversation with NP regarding potential behaviors, CSW contacted [name] to pick pt up. [name] stated he will pick pt during the aforementioned timeframe.  Wyvonnia Lora, LCSWA 11/27/2020  12:50 PM

## 2020-11-27 NOTE — ED Notes (Signed)
Breakfast given.  

## 2020-11-27 NOTE — ED Notes (Signed)
Pt discharged in no acute distress. Verbalized understanding of discharge instructions reviewed on AVS. Safety maintained. Pt discharged with caregiver.

## 2020-12-04 ENCOUNTER — Other Ambulatory Visit: Payer: Self-pay

## 2020-12-04 ENCOUNTER — Ambulatory Visit (HOSPITAL_COMMUNITY)
Admission: EM | Admit: 2020-12-04 | Discharge: 2020-12-04 | Disposition: A | Discharge status: Home / Self Care | Payer: No Typology Code available for payment source | Attending: Psychiatry | Admitting: Psychiatry

## 2020-12-04 DIAGNOSIS — G3184 Mild cognitive impairment, so stated: Secondary | ICD-10-CM | POA: Diagnosis not present

## 2020-12-04 DIAGNOSIS — F901 Attention-deficit hyperactivity disorder, predominantly hyperactive type: Secondary | ICD-10-CM | POA: Diagnosis not present

## 2020-12-04 DIAGNOSIS — R45851 Suicidal ideations: Secondary | ICD-10-CM | POA: Diagnosis not present

## 2020-12-04 NOTE — Discharge Instructions (Addendum)

## 2020-12-04 NOTE — Progress Notes (Signed)
   12/04/20 1240  [facility] Triage Screening (Walk-ins at [facility] only)  How Did You Hear About [place]? Legal System  What Is the Reason for Your Visit/Call Today? Patient presents via [employer] under IVC, initiated by [facility] staff.  Per IVC patient engaging in unsafe and erratic bahaviors, to include jumping from a balcony.  Patient confirms some allegations in petition, however notes most is from 2-3 yrs ago. He admits to jumping from a balcony, however the reason was to "prove a point, that they don't care about me. Neighbors were more worried."  He reports he is overall happy with placement, however feels staff are not very supportive. He shared that [name], [facility] administrator, has found another placement for patient within the next week.  Patient is currently denying SI, HI and AVH and he is able to affirm his safety.  How Long Has This Been Causing You Problems? 1 wk - 1 month  Have You Recently Had Any Thoughts About Hurting Yourself? No  Are You Planning to Commit Suicide/Harm Yourself At This time? No  Have you Recently Had Thoughts About Hurting Someone [name]? No  Are You Planning To Harm Someone At This Time? No  Are you currently experiencing any auditory, visual or other hallucinations? No  Have You Used Any Alcohol or Drugs in the Past 24 Hours? No  Do you have any current medical co-morbidities that require immediate attention? No  Please describe current medical co-morbidities that require immediate attention: Per EHR, IDD.  Clinician description of patient physical appearance/behavior: Pt is calm and cooperative, pleasant.  If access to [facility] was not available, would you have sought care in the Emergency Department? Yes  Determination of Need Urgent (48 hours)  Options For Referral Outpatient Therapy;DD/IDD Facility;Medication Management;Group Home
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
## 2020-12-04 NOTE — BHH Counselor (Addendum)
CSW spoke with [name], care coordinator [phone] to inform coordinator that pt is psych cleared and needs to be picked up. CSW also attempted to provide coordinator with outpatient mental health resources but coordinator declined stating pt has access to services. She was informed that the [facility] 's non emergent transportation will  more than likely have to be called to pick up pt and taken back to AFL since provider is unwilling to do so. She stated that "You all do what you need to do." She informed us that she is concerned for provider's safety and says "pt is very manipulative" and "knows what he's doing." She says that he will be going to another placement on the 5th. Social work informed pt that [facility] staff will have to call GDP [phone] to pick up pt.

## 2020-12-04 NOTE — BHH Counselor (Signed)
CSW spoke with pt's provider, [name] to inform him that pt is psychiatrically cleared and is ready to be picked up. [name] stated that he was unaware that pt. was ready and said that he feels it is unsafe to have pt at home since pt threatened his life. CSW noted provider concern but informed provider that the facility cannot hold pt because the NP has cleared pt medically and psychiatrically. See previous NP provider note for information. CSW was told that he thought pt would be going to the [facility] not the [facility] and was not anticipating pt to return this soon. He stated that he understood the situation and would be contacting the[facility] to inform them of his concern of pt returning home. CSW stated understanding and informed provider that pt would be returning via [facility] since pt was IVC'D here and can return via Patent examiner. Call ended without incident.   Andrew Erickson, MSW, LCSW-A 6/29/20225:23 PM
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
## 2020-12-04 NOTE — [facility] Counselor (Addendum)
Per request of FNP, Andrew Erickson,CSW contacted pt's guardian Andrew Erickson phone number [phone] and provider Andrew Erickson, [phone] respectively, regarding pt discharge. CSW spoke with pt's guardian, and stated that pt is ready to be discharged. Guardian informed CSW that the provider is the one who is picking pt up and that she is in [facility] and has no ability to provide transportation. She stated that the provider is  "very aware that that Andrew Erickson is ready to be picked up." She said that CSW could contact the provider and inform him of pt's status. She stated that he is also aware that he needs to be picked up by 5pm. She sated that if provider cannot arrive by 5pm then the law enforcement transportation would be a possibility since pt was IVC'd and arrived by Patent examiner.  Andrew Erickson, Andrew Erickson, Andrew Erickson 6/29/20224:29 PM

## 2022-07-05 IMAGING — CR DG CHEST 2V
2 series · 2 of 2 positions shown · non-contrast
Comparison: None.

CLINICAL DATA: Fall, right posterior pain

EXAM:
CHEST - 2 VIEW

[w chest pa]
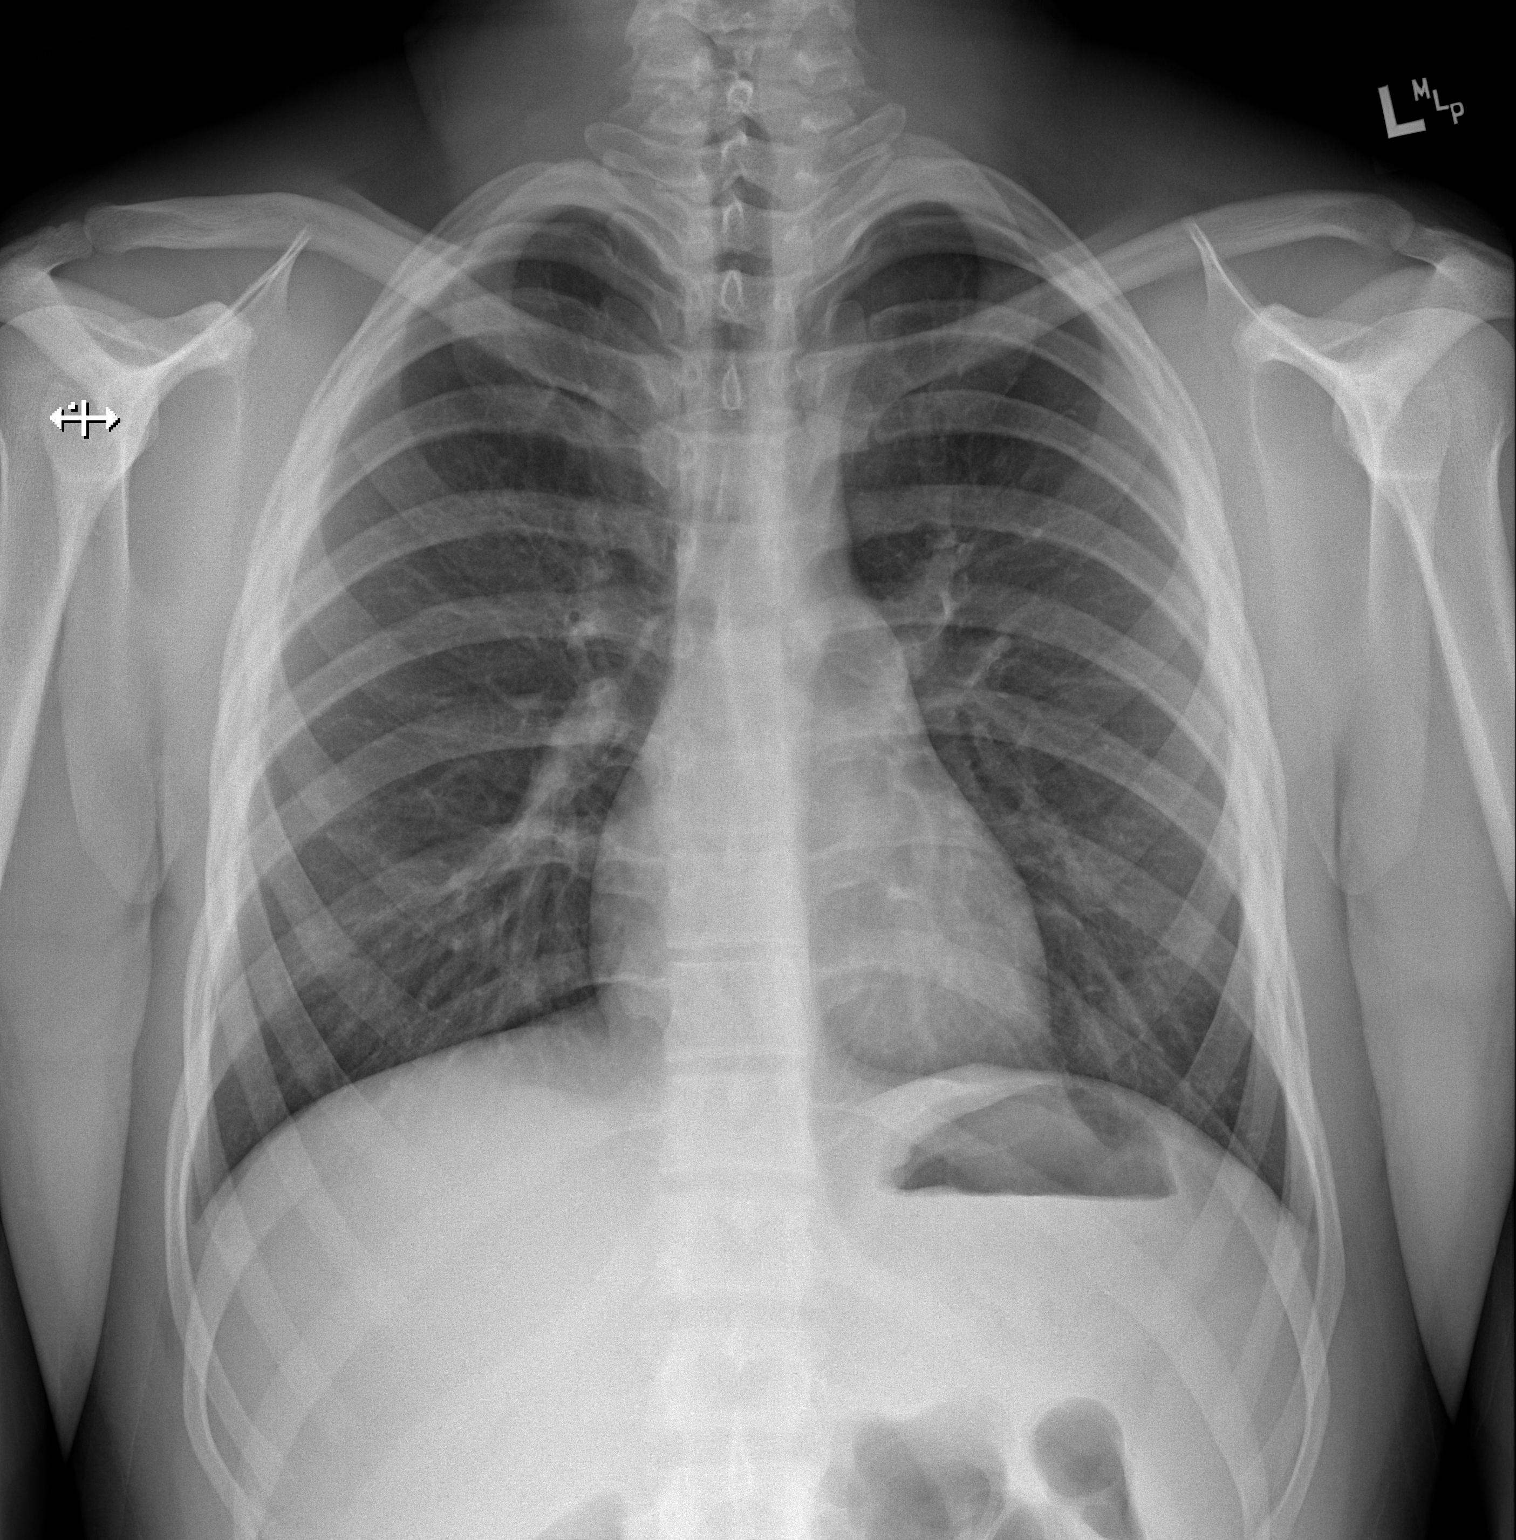

[w chest lat]
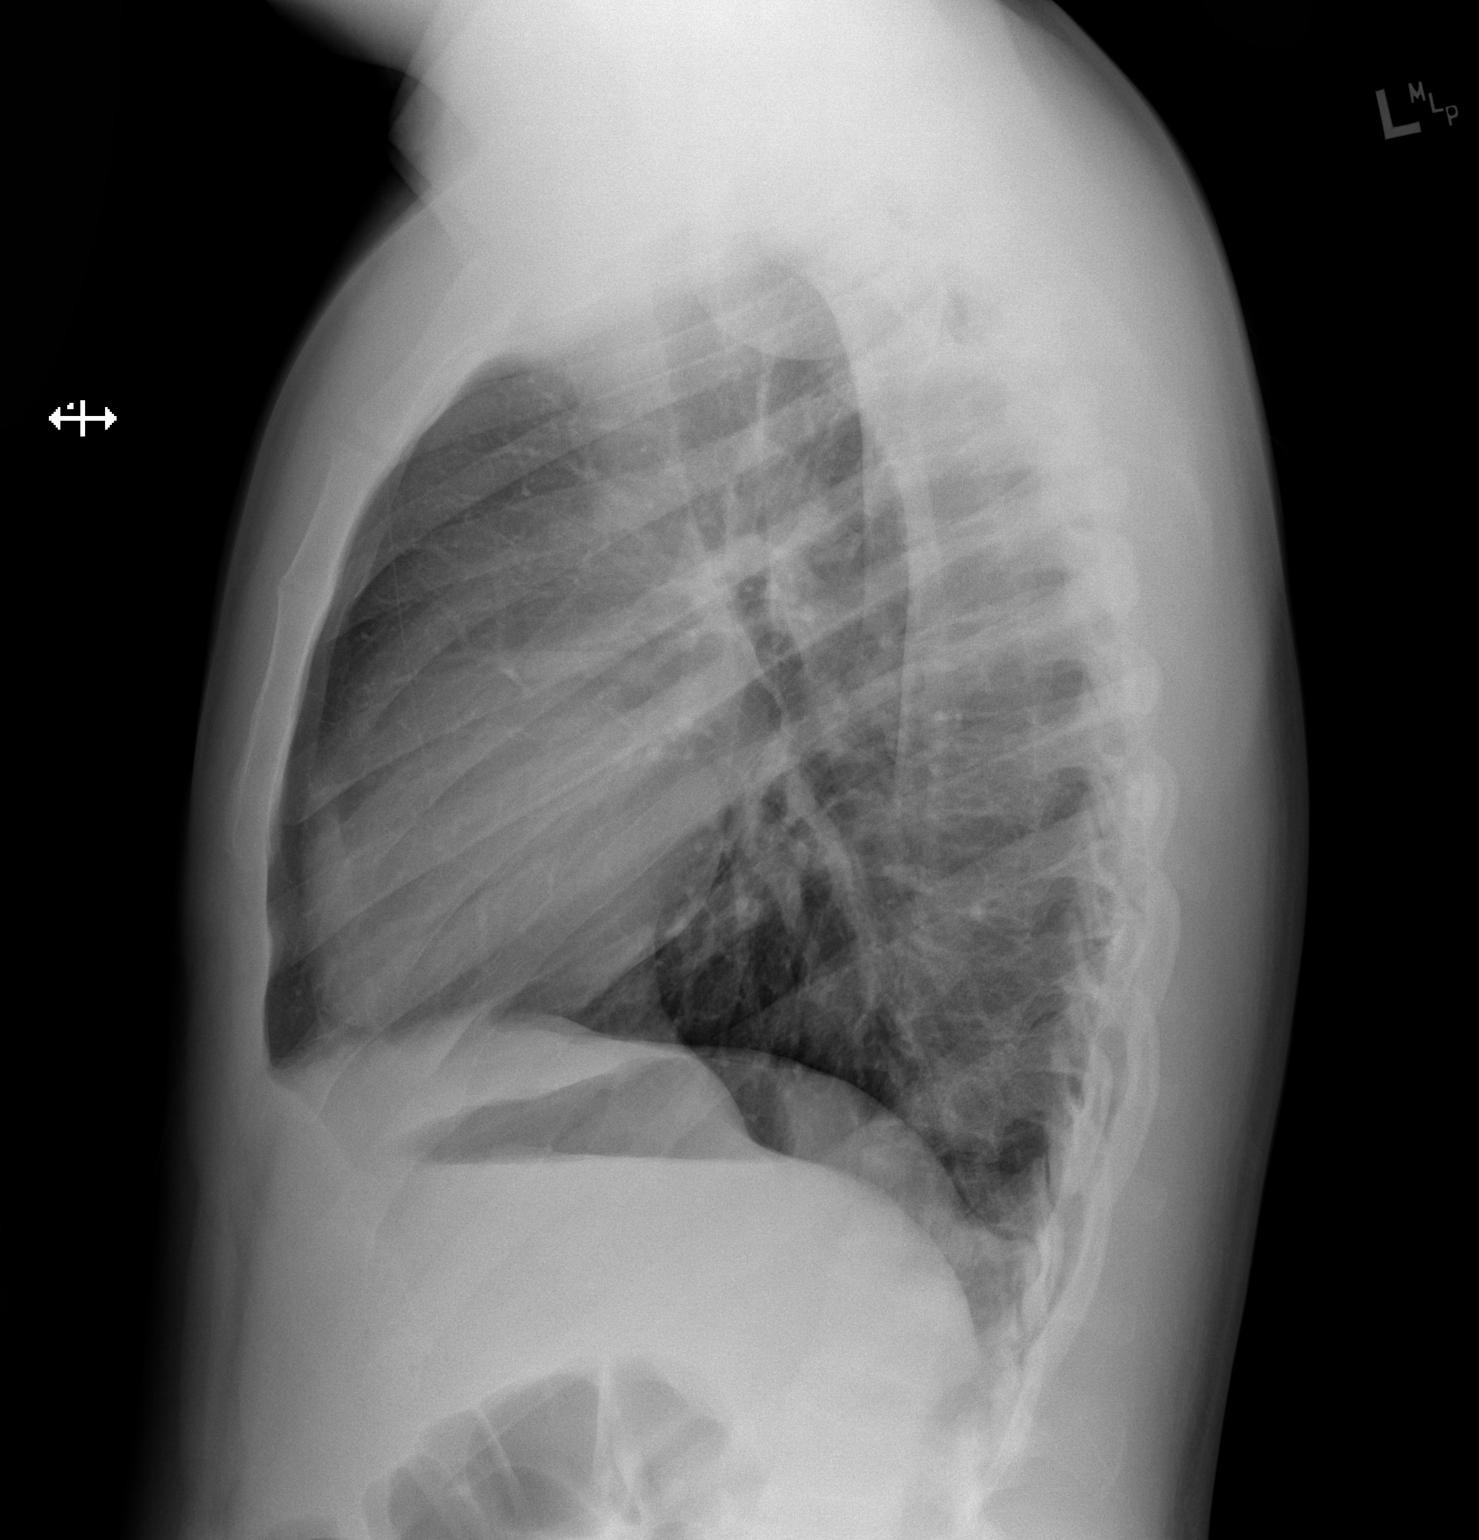

[2 of 2 positions shown; findings below may reference images not displayed]

FINDINGS: The heart size and mediastinal contours are within normal limits.
Both lungs are clear. The visualized skeletal structures are
unremarkable.
IMPRESSION: No active cardiopulmonary disease.
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
